# Patient Record
Sex: Female | Born: 1975 | Race: White | Hispanic: No | Marital: Married | Smoking: Current some day smoker
Health system: Southern US, Community
[De-identification: ages and names within clinical notes are randomized; demographics above are authoritative.]

## PROBLEM LIST (undated history)

## (undated) HISTORY — PX: EXTERNAL EAR SURGERY: SHX627

## (undated) HISTORY — PX: ABDOMINAL HYSTERECTOMY: SHX81

## (undated) HISTORY — PX: KNEE ARTHROPLASTY: SHX992

---

## 1998-05-12 ENCOUNTER — Other Ambulatory Visit: Admission: RE | Admit: 1998-05-12 | Discharge: 1998-05-12 | Payer: Self-pay | Admitting: Obstetrics and Gynecology

## 1999-03-31 ENCOUNTER — Other Ambulatory Visit: Admission: RE | Admit: 1999-03-31 | Discharge: 1999-03-31 | Payer: Self-pay | Admitting: Obstetrics and Gynecology

## 2000-04-13 ENCOUNTER — Other Ambulatory Visit: Admission: RE | Admit: 2000-04-13 | Discharge: 2000-04-13 | Payer: Self-pay | Admitting: Gynecology

## 2000-07-31 ENCOUNTER — Other Ambulatory Visit: Admission: RE | Admit: 2000-07-31 | Discharge: 2000-07-31 | Payer: Self-pay | Admitting: Obstetrics and Gynecology

## 2000-11-27 ENCOUNTER — Other Ambulatory Visit: Admission: RE | Admit: 2000-11-27 | Discharge: 2000-11-27 | Payer: Self-pay | Admitting: Gynecology

## 2001-06-21 ENCOUNTER — Other Ambulatory Visit: Admission: RE | Admit: 2001-06-21 | Discharge: 2001-06-21 | Payer: Self-pay | Admitting: *Deleted

## 2002-03-18 ENCOUNTER — Other Ambulatory Visit: Admission: RE | Admit: 2002-03-18 | Discharge: 2002-03-18 | Payer: Self-pay | Admitting: *Deleted

## 2002-06-26 ENCOUNTER — Other Ambulatory Visit: Admission: RE | Admit: 2002-06-26 | Discharge: 2002-06-26 | Payer: Self-pay | Admitting: *Deleted

## 2002-10-21 ENCOUNTER — Encounter: Admission: RE | Admit: 2002-10-21 | Discharge: 2003-01-19 | Payer: Self-pay | Admitting: *Deleted

## 2002-11-14 ENCOUNTER — Encounter: Admission: RE | Admit: 2002-11-14 | Discharge: 2002-11-14 | Payer: Self-pay | Admitting: Family Medicine

## 2003-07-21 ENCOUNTER — Other Ambulatory Visit: Admission: RE | Admit: 2003-07-21 | Discharge: 2003-07-21 | Payer: Self-pay | Admitting: Gynecology

## 2004-08-10 ENCOUNTER — Other Ambulatory Visit: Admission: RE | Admit: 2004-08-10 | Discharge: 2004-08-10 | Payer: Self-pay | Admitting: Gynecology

## 2005-09-15 ENCOUNTER — Other Ambulatory Visit: Admission: RE | Admit: 2005-09-15 | Discharge: 2005-09-15 | Payer: Self-pay | Admitting: Obstetrics and Gynecology

## 2006-01-04 ENCOUNTER — Ambulatory Visit (HOSPITAL_COMMUNITY): Admission: RE | Admit: 2006-01-04 | Discharge: 2006-01-04 | Payer: Self-pay | Admitting: Obstetrics and Gynecology

## 2006-02-04 ENCOUNTER — Inpatient Hospital Stay (HOSPITAL_COMMUNITY): Admission: AD | Admit: 2006-02-04 | Discharge: 2006-02-11 | Payer: Self-pay | Admitting: Obstetrics and Gynecology

## 2006-02-05 ENCOUNTER — Ambulatory Visit: Payer: Self-pay | Admitting: Neonatology

## 2006-04-06 ENCOUNTER — Ambulatory Visit (HOSPITAL_COMMUNITY): Admission: RE | Admit: 2006-04-06 | Discharge: 2006-04-06 | Payer: Self-pay | Admitting: Obstetrics and Gynecology

## 2006-11-27 ENCOUNTER — Inpatient Hospital Stay (HOSPITAL_COMMUNITY): Admission: AD | Admit: 2006-11-27 | Discharge: 2006-11-27 | Payer: Self-pay | Admitting: Obstetrics and Gynecology

## 2006-11-28 ENCOUNTER — Inpatient Hospital Stay (HOSPITAL_COMMUNITY): Admission: AD | Admit: 2006-11-28 | Discharge: 2006-12-01 | Payer: Self-pay | Admitting: Obstetrics and Gynecology

## 2006-12-03 ENCOUNTER — Inpatient Hospital Stay (HOSPITAL_COMMUNITY): Admission: AD | Admit: 2006-12-03 | Discharge: 2006-12-03 | Payer: Self-pay | Admitting: Obstetrics and Gynecology

## 2006-12-16 ENCOUNTER — Inpatient Hospital Stay (HOSPITAL_COMMUNITY): Admission: AD | Admit: 2006-12-16 | Discharge: 2006-12-18 | Payer: Self-pay | Admitting: Obstetrics and Gynecology

## 2008-07-23 ENCOUNTER — Inpatient Hospital Stay (HOSPITAL_COMMUNITY): Admission: AD | Admit: 2008-07-23 | Discharge: 2008-07-24 | Payer: Self-pay | Admitting: Obstetrics and Gynecology

## 2008-09-18 ENCOUNTER — Ambulatory Visit (HOSPITAL_COMMUNITY): Admission: RE | Admit: 2008-09-18 | Discharge: 2008-09-18 | Payer: Self-pay | Admitting: Obstetrics and Gynecology

## 2009-02-15 ENCOUNTER — Encounter: Admission: RE | Admit: 2009-02-15 | Discharge: 2009-02-15 | Payer: Self-pay | Admitting: Chiropractic Medicine

## 2009-04-22 ENCOUNTER — Emergency Department (HOSPITAL_COMMUNITY): Admission: EM | Admit: 2009-04-22 | Discharge: 2009-04-22 | Payer: Self-pay | Admitting: Emergency Medicine

## 2009-11-05 ENCOUNTER — Ambulatory Visit (HOSPITAL_COMMUNITY): Admission: RE | Admit: 2009-11-05 | Discharge: 2009-11-05 | Payer: Self-pay | Admitting: Obstetrics and Gynecology

## 2010-03-08 ENCOUNTER — Encounter (INDEPENDENT_AMBULATORY_CARE_PROVIDER_SITE_OTHER): Payer: Self-pay | Admitting: Obstetrics and Gynecology

## 2010-03-08 ENCOUNTER — Ambulatory Visit (HOSPITAL_COMMUNITY): Admission: RE | Admit: 2010-03-08 | Discharge: 2010-03-09 | Payer: Self-pay | Admitting: Obstetrics and Gynecology

## 2010-03-16 ENCOUNTER — Inpatient Hospital Stay (HOSPITAL_COMMUNITY): Admission: AD | Admit: 2010-03-16 | Discharge: 2010-03-16 | Payer: Self-pay | Admitting: Obstetrics & Gynecology

## 2010-05-28 ENCOUNTER — Encounter: Admission: RE | Admit: 2010-05-28 | Discharge: 2010-05-28 | Payer: Self-pay | Admitting: Orthopaedic Surgery

## 2010-11-05 LAB — URINE MICROSCOPIC-ADD ON

## 2010-11-05 LAB — DIFFERENTIAL
Basophils Absolute: 0.1 10*3/uL (ref 0.0–0.1)
Lymphocytes Relative: 10 % — ABNORMAL LOW (ref 12–46)
Lymphs Abs: 1.5 10*3/uL (ref 0.7–4.0)
Monocytes Absolute: 1.4 10*3/uL — ABNORMAL HIGH (ref 0.1–1.0)
Monocytes Relative: 9 % (ref 3–12)
Neutro Abs: 11.8 10*3/uL — ABNORMAL HIGH (ref 1.7–7.7)

## 2010-11-05 LAB — COMPREHENSIVE METABOLIC PANEL
Albumin: 3 g/dL — ABNORMAL LOW (ref 3.5–5.2)
BUN: 8 mg/dL (ref 6–23)
Creatinine, Ser: 0.85 mg/dL (ref 0.4–1.2)
Total Protein: 6.8 g/dL (ref 6.0–8.3)

## 2010-11-05 LAB — CBC
HCT: 29.1 % — ABNORMAL LOW (ref 36.0–46.0)
Hemoglobin: 10.4 g/dL — ABNORMAL LOW (ref 12.0–15.0)
MCHC: 35.3 g/dL (ref 30.0–36.0)
MCV: 92.5 fL (ref 78.0–100.0)
Platelets: 323 10*3/uL (ref 150–400)
RBC: 3.15 MIL/uL — ABNORMAL LOW (ref 3.87–5.11)
RBC: 3.17 MIL/uL — ABNORMAL LOW (ref 3.87–5.11)
WBC: 15.1 10*3/uL — ABNORMAL HIGH (ref 4.0–10.5)

## 2010-11-05 LAB — URINALYSIS, ROUTINE W REFLEX MICROSCOPIC
Hgb urine dipstick: NEGATIVE
Nitrite: NEGATIVE
Specific Gravity, Urine: 1.025 (ref 1.005–1.030)
pH: 5.5 (ref 5.0–8.0)

## 2010-11-05 LAB — URINE CULTURE

## 2010-11-06 LAB — CBC
HCT: 39.5 % (ref 36.0–46.0)
MCV: 92.7 fL (ref 78.0–100.0)
RDW: 12.4 % (ref 11.5–15.5)
WBC: 7.2 10*3/uL (ref 4.0–10.5)

## 2010-11-13 LAB — CBC
HCT: 41.3 % (ref 36.0–46.0)
Hemoglobin: 13.9 g/dL (ref 12.0–15.0)
MCV: 92.4 fL (ref 78.0–100.0)
Platelets: 314 10*3/uL (ref 150–400)
RBC: 4.47 MIL/uL (ref 3.87–5.11)
WBC: 8.1 10*3/uL (ref 4.0–10.5)

## 2010-11-13 LAB — COMPREHENSIVE METABOLIC PANEL
BUN: 11 mg/dL (ref 6–23)
CO2: 28 mEq/L (ref 19–32)
Chloride: 103 mEq/L (ref 96–112)
Creatinine, Ser: 0.74 mg/dL (ref 0.4–1.2)
GFR calc non Af Amer: >60 mL/min (ref >60)
Glucose, Bld: 90 mg/dL (ref 70–99)
Total Bilirubin: 0.7 mg/dL (ref 0.3–1.2)

## 2010-11-25 LAB — BASIC METABOLIC PANEL
Calcium: 9.4 mg/dL (ref 8.4–10.5)
GFR calc non Af Amer: >60 mL/min (ref >60)
Glucose, Bld: 118 mg/dL — ABNORMAL HIGH (ref 70–99)
Sodium: 135 mEq/L (ref 135–145)

## 2010-11-25 LAB — URINE MICROSCOPIC-ADD ON

## 2010-11-25 LAB — URINALYSIS, ROUTINE W REFLEX MICROSCOPIC
Nitrite: NEGATIVE
Protein, ur: 100 mg/dL — AB
Specific Gravity, Urine: 1.011 (ref 1.005–1.030)
Urobilinogen, UA: 0.2 mg/dL (ref 0.0–1.0)

## 2010-11-25 LAB — CBC
Hemoglobin: 13.5 g/dL (ref 12.0–15.0)
Platelets: 256 10*3/uL (ref 150–400)
RDW: 12.8 % (ref 11.5–15.5)
WBC: 18.3 10*3/uL — ABNORMAL HIGH (ref 4.0–10.5)

## 2010-11-25 LAB — TYPE AND SCREEN: Antibody Screen: NEGATIVE

## 2010-11-25 LAB — DIFFERENTIAL
Basophils Absolute: 0 10*3/uL (ref 0.0–0.1)
Lymphocytes Relative: 8 % — ABNORMAL LOW (ref 12–46)
Monocytes Absolute: 0.9 10*3/uL (ref 0.1–1.0)
Neutro Abs: 16 10*3/uL — ABNORMAL HIGH (ref 1.7–7.7)

## 2010-11-25 LAB — POCT PREGNANCY, URINE: Preg Test, Ur: NEGATIVE

## 2010-12-05 LAB — HCG, SERUM, QUALITATIVE: Preg, Serum: NEGATIVE

## 2010-12-05 LAB — CBC
MCHC: 33.8 g/dL (ref 30.0–36.0)
RDW: 12.3 % (ref 11.5–15.5)

## 2011-01-03 NOTE — Op Note (Signed)
NAME:  Shannon Ramos, Shannon Ramos             ACCOUNT NO.:  [id number]   MEDICAL RECORD NO.:  [id number]          PATIENT TYPE:  AMB   LOCATION:  SDC                           FACILITY:  [facility]   PHYSICIAN:  Guy Sandifer. Henderson Cloud, M.D. DATE OF BIRTH:  Nov 09, 1975   DATE OF PROCEDURE:  DATE OF DISCHARGE:                               OPERATIVE REPORT   PREOPERATIVE DIAGNOSIS:  Desires permanent sterilization   POSTOPERATIVE DIAGNOSES:  Desires permanent sterilization and  endometriosis.   PROCEDURE:  Laparoscopy, bilateral tubal ligation with Filshie clips,  ablation of endometriosis.   SURGEON:  Guy Sandifer. Henderson Cloud, MD   ANESTHESIA:  General with endotracheal intubation.   ESTIMATED BLOOD LOSS:  Minimal.   INDICATIONS AND CONSENT:  This patient is a 35 year old married white  female, G4, P3, desires permanent sterilization.  Alternate methods have  been discussed.  Laparoscopy with Filshie clip application has been  discussed.  Potential risks and complications were discussed  preoperatively including but not limited to infection, organ damage,  bleeding requiring transfusion of blood products with HIV and hepatitis  acquisition, DVT, PE, pneumonia.  Permanence of the procedure, failure  rate, and increased ectopic risk has been reviewed.  All questions are  answered and consent is signed on the chart.   FINDINGS:  Upper abdomen is grossly normal.  Tubes and ovaries normal  bilaterally.  Anterior cul-de-sac is normal.  Immediately lateral to the  insertion of the left uterosacral ligament are dark brown patches of  endometriosis.   PROCEDURE:  The patient was taken to the operating room where she was  identified, placed in dorsal supine position and general anesthesia was  induced via endotracheal intubation.  She was then placed in the dorsal  lithotomy position where she was prepped abdominally and vaginally.  Bladder straight catheterized.  Hulka tenaculum was placed.  The uterus  was then  manipulated, and she was draped in a sterile fashion.  The  infraumbilical and suprapubic areas were injected in the midline with  0.5% plain Marcaine.  Small infraumbilical incision was made, and  disposable Veress needle was placed on the first attempt without  difficulty.  Normal syringe and drop test were noted.  Two liters of gas  were then insufflated under low pressure with good tympany in the right  upper quadrant.  Veress needle was removed, and a 10/11 Xcel bladeless  disposable trocar sleeve was placed using direct visualization with the  diagnostic laparoscopic.  After placement, the operative laparoscope was  used.  A small suprapubic incision was made, and a 5-mm Xcel bladeless  disposable trocar sleeve was placed in the midline under direct  visualization without difficulty.  The above findings were noted.  The  course of the left ureter was identified, seen to be clear in the area  of surgery.  Bipolar cautery was used to ablate the areas of  endometriosis.  Right fallopian tube was identified from cornu to  fimbria.  Filshie clip was applied in the proximal one-third.  Similar  procedure was carried out on the left side.  The Filshie clip applicator  was removed  and careful inspection reveals the full width of the tube to  be within the clip and the heel of the clip to be visualized through the  mesosalpinx.  The was true bilaterally.  A 10 mL of 0.5% plain Marcaine  was instilled in the peritoneal cavity.  All inserts were removed.  Suprapubic trocar sleeve was removed.  Pneumoperitoneum was reduced.  The umbilical trocar sleeve  was removed.  The skin incisions were closed with subcuticular 3-0  Monocryl suture.  Dermabond applied.  Hulka tenaculum was removed and no  bleeding was noted.  All counts were correct.  The patient was awakened  and taken to recovery room in stable condition.      Guy Sandifer Henderson Cloud, M.D.  Electronically Signed     JET/MEDQ  D:   09/18/2008  T:  09/19/2008  Job:  [id number]

## 2011-01-03 NOTE — H&P (Signed)
NAME:  Shannon Ramos, Shannon Ramos             ACCOUNT NO.:  [id number]   MEDICAL RECORD NO.:  [id number]          PATIENT TYPE:  AMB   LOCATION:  SDC                           [facility]ACILITY:  [facility]   PHYSICIAN:  Guy Sandifer. Henderson Cloud, M.D. DATE OF BIRTH:  11/13/1975   DATE OF ADMISSION:  DATE OF DISCHARGE:                              HISTORY & PHYSICAL   CHIEF COMPLAINT:  Desires permanent sterilization.   HISTORY OF PRESENT ILLNESS:  This patient is a 36 year old married white  female G4, P3 who desires permanent sterilization.  After discussion of  options she is being admitted for  laparoscopy with Filshie clip  application.  Potential risks and complications, alternatives have been  discussed preoperatively.   PAST MEDICAL HISTORY:  Negative.   PAST SURGICAL HISTORY:  Ear surgery at 35 years old.  Knee surgery x2.   OBSTETRICAL HISTORY:  Vaginal delivery x3.   FAMILY HISTORY:  Positive for heart disease, psychiatric disease, and  cancer.   MEDICATIONS:  None.   ALLERGIES:  No known drug allergies.   SOCIAL HISTORY:  Denies tobacco, alcohol, or drug abuse.   REVIEW OF SYSTEMS:  NEURO:  Denies headache.  CARDIAC:  Denies chest pain.  PULMONARY:  Denies shortness of breath.   PHYSICAL EXAMINATION:  VITAL SIGNS:  Height 5 feet 8 inches, weight 146  pounds, and blood pressure was 102/66.  LUNGS:  Clear to auscultation.  HEART:  Regular rate and rhythm.  ABDOMEN:  Soft and nontender without masses.  PELVIC:  Vulva, vagina, and cervix without lesion.  Uterus up and normal  size, mobile, and nontender.  Adnexa nontender without masses  EXTREMITIES:  Grossly within normal limits.  NEUROLOGICAL:  Grossly within normal limits.   ASSESSMENT:  Multiparity, desires permanent sterilization.   PLAN:  Laparoscopy with Filshie clip application.      Guy Sandifer Henderson Cloud, M.D.  Electronically Signed     JET/MEDQ  D:  09/16/2008  T:  09/17/2008  Job:  [id number]

## 2011-01-06 NOTE — Discharge Summary (Signed)
NAMENOUR, RODRIGUES             ACCOUNT NO.:  [id number]   MEDICAL RECORD NO.:  [id number]          PATIENT TYPE:  INP   LOCATION:  9158                          FACILITY:  [facility]   PHYSICIAN:  Juluis Mire, M.D.   DATE OF BIRTH:  1975/09/18   DATE OF ADMISSION:  11/28/2006  DATE OF DISCHARGE:  12/01/2006                               DISCHARGE SUMMARY   ADMITTING DIAGNOSIS:  Intrauterine pregnancy at 33+ weeks with preterm  labor.   DISCHARGE DIAGNOSIS:  Intrauterine pregnancy at 33+ weeks with preterm  labor.   For a complete history and physical, see written note.   HOSPITAL COURSE:  The patient was admitted at 33-5/7 for preterm labor  and cervical dilatation.  She did not respond to Terbutaline and was  started on magnesium sulfate.  She did stop contracting with magnesium  sulfate.  We switched her to Procardia which remained stable, and the  cervix actually improved.  It was 2 cm and 50% effaced.  She had  received betamethasone.  We also performed an ultrasound.  It showed an  estimated fetal weight of 5 pounds which was 55th percentile.  The  amniotic fluid was the 38th percentile.  Biophysical profile was 8/10.  On the day of discharge, she was remaining on the Procardia.  She had no  uterine activity.  The fetal heart rate was reactive.  The uterus was  nontender, and she was discharged home.   COMPLICATIONS:  None during stay in the hospital.  The patient  discharged home in stable condition.   DISPOSITION:  The patient to be at relative rest at home.  Continue  Procardia.  Preterm labor warning signs are given.  She will follow up  in the office early next week.      Juluis Mire, M.D.  Electronically Signed     JSM/MEDQ  D:  12/01/2006  T:  12/01/2006  Job:  [id number]

## 2011-01-06 NOTE — H&P (Signed)
NAME:  Shannon Ramos, Shannon Ramos             ACCOUNT NO.:  [id number]   MEDICAL RECORD NO.:  [id number]          PATIENT TYPE:  AMB   LOCATION:  SDC                           [facility]   PHYSICIAN:  Guy Sandifer. Henderson Cloud, M.D. DATE OF BIRTH:  30-Aug-1975   DATE OF ADMISSION:  04/06/2006  DATE OF DISCHARGE:                                HISTORY & PHYSICAL   CHIEF COMPLAINT:  Abnormal bleeding.   HISTORY OF PRESENT ILLNESS:  This patient is a 35 year old, married, white  female, G2, P1, status post vaginal delivery of a healthy child on February 09, 2006 who was doing well at her postpartum visit on March 12, 2006.  Following  that, she noticed increased bright red bleeding on and off.  She passed a  peanut-shaped bit of tissue from the vagina.  She has had no fever,  vomiting, or shaking chills.  Bleeding has not been heavier than a normal  cycle.  However, due to the tissue she passed, she was concerned about the  possibility of retained products of conception.  On examination on April 03, 2006, a 1 x 3 cm portion of tissue was noted in the vagina at that time  of exam.  The uterus was otherwise nontender and mobile.  An ultrasound that  day revealed a 9 x 3.9 x 6.3 cm uterus with a 15 mm echogenic area in the  endometrial cavity suspicious for retained products of conception.  After  discussion of the options, she is being admitted for dilatation and  evacuation.  Potential risks and complications have been discussed  preoperatively.   PAST MEDICAL HISTORY:  Anxiety and depression.   PAST SURGICAL HISTORY:  Reconstruction of left knee in 2000.   FAMILY HISTORY:  Skin cancer in maternal grandfather.  Depression in  maternal grandmother.   OB HISTORY:  Vaginal delivery as above.   MEDICATIONS:  Vitamins.   ALLERGIES:  No known drug allergies.   SOCIAL HISTORY:  Denies tobacco, alcohol or drug abuse.   REVIEW OF SYSTEMS:  NEUROLOGIC:  Denies headache.   REVIEW OF SYSTEMS:  NEUROLOGIC:   Denies headache.  CARDIOVASCULAR:  Denies  chest pain.  PULMONARY:  Denies shortness of breath.  GI:  Denies recent  changes in bowel habits.   PHYSICAL EXAMINATION:  VITAL SIGNS:  Height 5 feet 8 inches, weight 149  pounds.  Blood pressure 128/88.  LUNGS:  Clear to auscultation.  HEART:  Regular rate and rhythm.  BREASTS: Not examined.  ABDOMEN:  Soft, nontender without masses.  PELVIC:  Vulva, vagina and cervix without lesion.  Uterus is mobile,  nontender.  Adnexa nontender without masses.  EXTREMITIES:  Grossly within normal limits.  NEUROLOGIC: Grossly within normal limits.   ASSESSMENT:  Irregular menses, possible retained products of conception.   PLAN:  Dilatation and evacuation.      Guy Sandifer Henderson Cloud, M.D.  Electronically Signed     JET/MEDQ  D:  04/05/2006  T:  04/05/2006  Job:  [id number]

## 2011-05-26 LAB — CBC
Hemoglobin: 10.7 g/dL — ABNORMAL LOW (ref 12.0–15.0)
Platelets: 338 10*3/uL (ref 150–400)
RBC: 3.19 MIL/uL — ABNORMAL LOW (ref 3.87–5.11)
WBC: 16.5 10*3/uL — ABNORMAL HIGH (ref 4.0–10.5)

## 2011-05-26 LAB — RPR: RPR Ser Ql: NONREACTIVE

## 2013-03-31 ENCOUNTER — Encounter (HOSPITAL_BASED_OUTPATIENT_CLINIC_OR_DEPARTMENT_OTHER): Payer: Self-pay | Admitting: *Deleted

## 2013-03-31 ENCOUNTER — Emergency Department (HOSPITAL_BASED_OUTPATIENT_CLINIC_OR_DEPARTMENT_OTHER)
Admission: EM | Admit: 2013-03-31 | Discharge: 2013-04-01 | Disposition: A | Discharge status: Home / Self Care | Payer: Managed Care, Other (non HMO) | Attending: Emergency Medicine | Admitting: Emergency Medicine

## 2013-03-31 ENCOUNTER — Emergency Department (HOSPITAL_BASED_OUTPATIENT_CLINIC_OR_DEPARTMENT_OTHER): Payer: Managed Care, Other (non HMO)

## 2013-03-31 DIAGNOSIS — S0180XA Unspecified open wound of other part of head, initial encounter: Secondary | ICD-10-CM | POA: Insufficient documentation

## 2013-03-31 DIAGNOSIS — F17200 Nicotine dependence, unspecified, uncomplicated: Secondary | ICD-10-CM | POA: Insufficient documentation

## 2013-03-31 DIAGNOSIS — Y9289 Other specified places as the place of occurrence of the external cause: Secondary | ICD-10-CM | POA: Insufficient documentation

## 2013-03-31 DIAGNOSIS — S01111A Laceration without foreign body of right eyelid and periocular area, initial encounter: Secondary | ICD-10-CM

## 2013-03-31 DIAGNOSIS — W64XXXA Exposure to other animate mechanical forces, initial encounter: Secondary | ICD-10-CM | POA: Insufficient documentation

## 2013-03-31 DIAGNOSIS — Y9389 Activity, other specified: Secondary | ICD-10-CM | POA: Insufficient documentation

## 2013-03-31 DIAGNOSIS — S060X0A Concussion without loss of consciousness, initial encounter: Secondary | ICD-10-CM

## 2013-03-31 MED ORDER — HYDROCODONE-ACETAMINOPHEN 5-325 MG PO TABS
1.0000 | ORAL_TABLET | Freq: Once | ORAL | Status: AC
  Administered 2013-03-31: 1 via ORAL
  Filled 2013-03-31: qty 1

## 2013-03-31 NOTE — ED Notes (Signed)
Pt escorted to restroom by this RT.

## 2013-03-31 NOTE — ED Provider Notes (Signed)
CSN: [id number]     Arrival date & time 03/31/13  2048 History     First MD Initiated Contact with Patient 03/31/13 2151     Chief Complaint  Patient presents with  . Head Laceration   (Consider location/radiation/quality/duration/timing/severity/associated sxs/prior Treatment) HPI Patient presents to the emergency department with laceration above her right from a horse kicking her in the face.  Patient, states she was hit by the knee of the horse, not the hoof.  Patient denies loss of consciousness, nausea, vomiting, diarrhea, incontinence, blurred vision, weakness, numbness, dizziness, syncope, seizure, neck pain, chest pain, or shortness of breath.  Patient, states, that she did not take any medications prior to arrival.  Patient, states, that she did apply pressure to the wound. History reviewed. No pertinent past medical history. Past Surgical History  Procedure Laterality Date  . Abdominal hysterectomy    . Knee arthroplasty     History reviewed. No pertinent family history. History  Substance Use Topics  . Smoking status: Current Some Day Smoker  . Smokeless tobacco: Not on file  . Alcohol Use: No   OB History   Grav Para Term Preterm Abortions TAB SAB Ect Mult Living                 Review of Systems All other systems negative except as documented in the HPI. All pertinent positives and negatives as reviewed in the HPI. Allergies  Review of patient's allergies indicates no known allergies.  Home Medications  No current outpatient prescriptions on file. BP 118/78  Pulse 67  Temp(Src) 98 F (36.7 C) (Oral)  Resp 16  Ht 5\' 8"  (1.727 m)  Wt 135 lb (61.236 kg)  BMI 20.53 kg/m2  SpO2 100% Physical Exam  Nursing note and vitals reviewed. Constitutional: She is oriented to person, place, and time. She appears well-developed and well-nourished.  HENT:  Head: Normocephalic.  Mouth/Throat: Oropharynx is clear and moist.  Eyes: EOM are normal. Pupils are equal, round,  and reactive to light.  Neck: Normal range of motion. Neck supple.  Cardiovascular: Normal rate, regular rhythm and normal heart sounds.  Exam reveals no gallop and no friction rub.   No murmur heard. Pulmonary/Chest: Effort normal and breath sounds normal. She exhibits no tenderness and no deformity.  Musculoskeletal:       Cervical back: Normal.       Thoracic back: Normal.       Lumbar back: Normal.  Neurological: She is alert and oriented to person, place, and time. She exhibits normal muscle tone. Coordination and gait normal. GCS eye subscore is 4. GCS verbal subscore is 5. GCS motor subscore is 6.    ED Course   Procedures (including critical care time)  Labs Reviewed - No data to display Ct Head Wo Contrast  03/31/2013   *RADIOLOGY REPORT*  Clinical Data:  History of trauma after being kicked in the head by horse.  CT HEAD WITHOUT CONTRAST CT MAXILLOFACIAL WITHOUT CONTRAST  Technique:  Multidetector CT imaging of the head and maxillofacial structures were performed using the standard protocol without intravenous contrast. Multiplanar CT image reconstructions of the maxillofacial structures were also generated.  Comparison:  Head CT 04/22/2009.  CT HEAD  Findings: No acute displaced skull fractures are identified.  No acute intracranial abnormality.  Specifically, no evidence of acute post-traumatic intracranial hemorrhage, no definite regions of acute/subacute cerebral ischemia, no focal mass, mass effect, hydrocephalus or abnormal intra or extra-axial fluid collections. The visualized paranasal sinuses and  mastoids are well pneumatized.  IMPRESSION: 1.  No acute displaced skull fractures or acute intracranial abnormalities. 2.  The appearance of the brain is normal.  CT MAXILLOFACIAL  Findings:   No acute facial bone fractures are identified. Pterygoid plates are intact.  Mandibular condyles are located bilaterally.  Bilateral globes and retro-orbital soft tissues are grossly normal.   IMPRESSION: 1.  No evidence of significant acute traumatic injury to the facial bones.   Original Report Authenticated By: Trudie Reed, M.D.   Ct Maxillofacial Wo Cm  03/31/2013   *RADIOLOGY REPORT*  Clinical Data:  History of trauma after being kicked in the head by horse.  CT HEAD WITHOUT CONTRAST CT MAXILLOFACIAL WITHOUT CONTRAST  Technique:  Multidetector CT imaging of the head and maxillofacial structures were performed using the standard protocol without intravenous contrast. Multiplanar CT image reconstructions of the maxillofacial structures were also generated.  Comparison:  Head CT 04/22/2009.  CT HEAD  Findings: No acute displaced skull fractures are identified.  No acute intracranial abnormality.  Specifically, no evidence of acute post-traumatic intracranial hemorrhage, no definite regions of acute/subacute cerebral ischemia, no focal mass, mass effect, hydrocephalus or abnormal intra or extra-axial fluid collections. The visualized paranasal sinuses and mastoids are well pneumatized.  IMPRESSION: 1.  No acute displaced skull fractures or acute intracranial abnormalities. 2.  The appearance of the brain is normal.  CT MAXILLOFACIAL  Findings:   No acute facial bone fractures are identified. Pterygoid plates are intact.  Mandibular condyles are located bilaterally.  Bilateral globes and retro-orbital soft tissues are grossly normal.  IMPRESSION: 1.  No evidence of significant acute traumatic injury to the facial bones.   Original Report Authenticated By: Trudie Reed, M.D.   LACERATION REPAIR Performed by: Carlyle Dolly Authorized by: Carlyle Dolly Consent: Verbal consent obtained. Risks and benefits: risks, benefits and alternatives were discussed Consent given by: patient Patient identity confirmed: provided demographic data Prepped and Draped in normal sterile fashion Wound explored  Laceration Location: Right eyebrow  Laceration Length: 4 cm  No Foreign Bodies  seen or palpated  Anesthesia: local infiltration  Local anesthetic: lidocaine 2% without epinephrine  Anesthetic total: 7 ml  Irrigation method: syringe Amount of cleaning: standard  Skin closure: 6-0 Prolene   Number of sutures: 11   Technique: Simple interrupted   Patient tolerance: Patient tolerated the procedure well with no immediate complications.  Patient is advised to return here for any worsening in her condition.  Also advised followup with her primary care Dr. told to have sutures out in 5 days MDM    Carlyle Dolly, PA-C 04/01/13 0036  Carlyle Dolly, PA-C 04/01/13 0036

## 2013-03-31 NOTE — ED Notes (Signed)
Pt c/o laceration above right eyebrow x 1 hr ago

## 2013-03-31 NOTE — ED Notes (Signed)
Pt reporting headache, MD notified.

## 2013-04-01 MED ORDER — HYDROCODONE-ACETAMINOPHEN 5-325 MG PO TABS: 1.0000 | ORAL_TABLET | Freq: Four times a day (QID) | ORAL | Status: DC | PRN

## 2013-04-01 NOTE — ED Provider Notes (Signed)
Medical screening examination/treatment/procedure(s) were performed by non-physician practitioner and as supervising physician I was immediately available for consultation/collaboration.  Jasmine Awe, MD 04/01/13 [phone]

## 2014-05-06 IMAGING — CT CT MAXILLOFACIAL W/O CM
1 series · 1 of 2 positions shown · non-contrast
Comparison: Head CT 04/22/2009.

CT HEAD

CLINICAL DATA: History of trauma after being kicked in the head by
horse.

CT HEAD WITHOUT CONTRAST
CT MAXILLOFACIAL WITHOUT CONTRAST
TECHNIQUE: Multidetector CT imaging of the head and maxillofacial
structures were performed using the standard protocol without
intravenous contrast. Multiplanar CT image reconstructions of the
maxillofacial structures were also generated.

[Series 1: topogram 0.6 t20s · sagittal · 1.00mm/px · 1 of 2 slices shown]
[im 2/2]
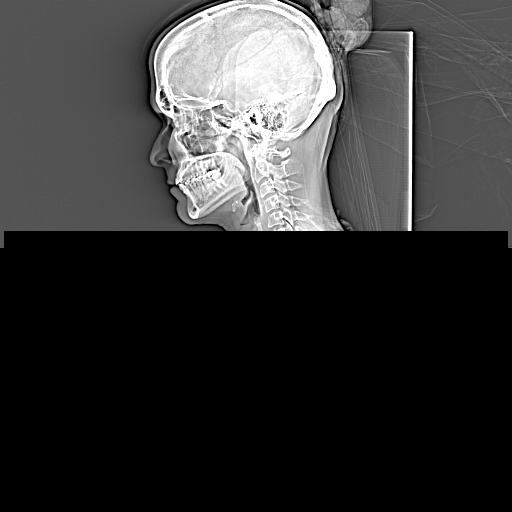

[1 of 2 positions shown; findings below may reference images not displayed]

FINDINGS: No acute displaced skull fractures are identified.  No
acute intracranial abnormality.  Specifically, no evidence of acute
post-traumatic intracranial hemorrhage, no definite regions of
acute/subacute cerebral ischemia, no focal mass, mass effect,
hydrocephalus or abnormal intra or extra-axial fluid collections.
The visualized paranasal sinuses and mastoids are well pneumatized.
IMPRESSION: 1.  No acute displaced skull fractures or acute intracranial
abnormalities.
2.  The appearance of the brain is normal.

CT MAXILLOFACIAL
FINDINGS: No acute facial bone fractures are identified.
Pterygoid plates are intact.  Mandibular condyles are located
bilaterally.  Bilateral globes and retro-orbital soft tissues are
grossly normal.
IMPRESSION: 1.  No evidence of significant acute traumatic injury to the facial
bones.

## 2015-10-22 ENCOUNTER — Other Ambulatory Visit: Payer: Self-pay | Admitting: Orthopaedic Surgery

## 2015-10-29 NOTE — H&P (Signed)
Shannon BundeLaura W Ramos is an 40 y.o. female.   Chief Complaint: left shoulder pain HPI: Shannon Ramos is here again about her left shoulder.  [facility] couple of weeks but the pain is now back.  Her pain is intermittent and moderate to severe.    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  Past Surgical History  Procedure Laterality Date  . Abdominal hysterectomy    . Knee arthroplasty      No family history on file. Social History:  reports that she has been smoking.  She does not have any smokeless tobacco history on file. She reports that she does not drink alcohol or use illicit drugs.  Allergies: No Known Allergies  No prescriptions prior to admission    No results found for this or any previous visit (from the past 48 hour(s)). No results found.  Review of Systems  Musculoskeletal: Positive for joint pain.       Left shoulder pain  All other systems reviewed and are negative.   There were no vitals taken for this visit. Physical Exam  Constitutional: She is oriented to person, place, and time. She appears well-developed and well-nourished.  HENT:  Head: Normocephalic and atraumatic.  Eyes: Pupils are equal, round, and reactive to light.  Neck: Normal range of motion.  Cardiovascular: Normal rate and regular rhythm.   Respiratory: Effort normal.  GI: Soft.  Musculoskeletal:  Left shoulder motion is full.  She has impingement pain in both positions.  She has good cuff strength which is painful in resisted external rotation.  She does have pain over her AC joint and pain to cross chest maneuvers.  Cervical motion is full and there is no palpable lymphadenopathy.  Sensation and motor function are intact in her hands with palpable pulses on both sides.    Neurological: She is alert and oriented to person, place, and time.  Skin: Skin is warm and dry.  Psychiatric: She has a normal mood  and affect. Her behavior is normal. Judgment and thought content normal.     Assessment/Plan Assessment: Left shoulder impingement and AC pain with MRI injected most recently 09/01/15  Plan: I think we can help Shannon Ramos with a shoulder arthroscopy.  Our intension will be to perform an acromioplasty and AC resection.  Based on her MRI she does not need a rotator cuff repair.  I think she'll be in a sling for a day or two and could get back to most of her regular jobs at the [employer] within a few days. I did review risk of anesthesia and infection related to the intervention.  Annaelle Kasel, Ginger OrganNDREW PAUL, PA-C 10/29/2015, 12:02 PM

## 2015-11-10 ENCOUNTER — Encounter (HOSPITAL_BASED_OUTPATIENT_CLINIC_OR_DEPARTMENT_OTHER): Payer: Self-pay | Admitting: *Deleted

## 2015-11-19 ENCOUNTER — Encounter (HOSPITAL_BASED_OUTPATIENT_CLINIC_OR_DEPARTMENT_OTHER)
Admission: RE | Disposition: A | Discharge status: Home / Self Care | Payer: Self-pay | Source: Ambulatory Visit | Attending: Orthopaedic Surgery

## 2015-11-19 ENCOUNTER — Encounter (HOSPITAL_BASED_OUTPATIENT_CLINIC_OR_DEPARTMENT_OTHER): Payer: Self-pay | Admitting: Anesthesiology

## 2015-11-19 ENCOUNTER — Ambulatory Visit (HOSPITAL_BASED_OUTPATIENT_CLINIC_OR_DEPARTMENT_OTHER): Payer: Managed Care, Other (non HMO) | Admitting: Anesthesiology

## 2015-11-19 ENCOUNTER — Ambulatory Visit (HOSPITAL_BASED_OUTPATIENT_CLINIC_OR_DEPARTMENT_OTHER)
Admission: RE | Admit: 2015-11-19 | Discharge: 2015-11-19 | Disposition: A | Discharge status: Home / Self Care | Payer: Managed Care, Other (non HMO) | Source: Ambulatory Visit | Attending: Orthopaedic Surgery | Admitting: Orthopaedic Surgery

## 2015-11-19 DIAGNOSIS — F17200 Nicotine dependence, unspecified, uncomplicated: Secondary | ICD-10-CM | POA: Diagnosis not present

## 2015-11-19 DIAGNOSIS — M7542 Impingement syndrome of left shoulder: Secondary | ICD-10-CM | POA: Diagnosis not present

## 2015-11-19 DIAGNOSIS — M19012 Primary osteoarthritis, left shoulder: Secondary | ICD-10-CM | POA: Diagnosis not present

## 2015-11-19 SURGERY — SHOULDER ARTHROSCOPY WITH SUBACROMIAL DECOMPRESSION AND DISTAL CLAVICLE EXCISION
Anesthesia: General | Site: Shoulder | Laterality: Left

## 2015-11-19 MED ORDER — DEXTROSE 5 % IV SOLN
2.0000 g | INTRAVENOUS | Status: AC
  Administered 2015-11-19: 2 g via INTRAVENOUS

## 2015-11-19 MED ORDER — CHLORHEXIDINE GLUCONATE 4 % EX LIQD: 60.0000 mL | Freq: Once | CUTANEOUS | Status: DC

## 2015-11-19 MED ORDER — FENTANYL CITRATE (PF) 100 MCG/2ML IJ SOLN
INTRAMUSCULAR | Status: AC
  Filled 2015-11-19: qty 2

## 2015-11-19 MED ORDER — DEXAMETHASONE SODIUM PHOSPHATE 4 MG/ML IJ SOLN
INTRAMUSCULAR | Status: DC | PRN
  Administered 2015-11-19: 10 mg via INTRAVENOUS

## 2015-11-19 MED ORDER — SUCCINYLCHOLINE CHLORIDE 20 MG/ML IJ SOLN
INTRAMUSCULAR | Status: AC
  Filled 2015-11-19: qty 1

## 2015-11-19 MED ORDER — FENTANYL CITRATE (PF) 100 MCG/2ML IJ SOLN
INTRAMUSCULAR | Status: DC | PRN
  Administered 2015-11-19: 100 ug via INTRAVENOUS

## 2015-11-19 MED ORDER — MIDAZOLAM HCL 2 MG/2ML IJ SOLN
1.0000 mg | INTRAMUSCULAR | Status: DC | PRN
  Administered 2015-11-19: 2 mg via INTRAVENOUS

## 2015-11-19 MED ORDER — SODIUM CHLORIDE 0.9 % IR SOLN
Status: DC | PRN
  Administered 2015-11-19: 4000 mL

## 2015-11-19 MED ORDER — PROPOFOL 10 MG/ML IV BOLUS
INTRAVENOUS | Status: AC
  Filled 2015-11-19: qty 20

## 2015-11-19 MED ORDER — FENTANYL CITRATE (PF) 100 MCG/2ML IJ SOLN
50.0000 ug | INTRAMUSCULAR | Status: DC | PRN
  Administered 2015-11-19: 100 ug via INTRAVENOUS

## 2015-11-19 MED ORDER — CEFAZOLIN SODIUM-DEXTROSE 2-4 GM/100ML-% IV SOLN
INTRAVENOUS | Status: AC
  Filled 2015-11-19: qty 100

## 2015-11-19 MED ORDER — LACTATED RINGERS IV SOLN
INTRAVENOUS | Status: DC
  Administered 2015-11-19 (×2): via INTRAVENOUS

## 2015-11-19 MED ORDER — SCOPOLAMINE 1 MG/3DAYS TD PT72: 1.0000 | MEDICATED_PATCH | Freq: Once | TRANSDERMAL | Status: DC | PRN

## 2015-11-19 MED ORDER — SUCCINYLCHOLINE CHLORIDE 20 MG/ML IJ SOLN
INTRAMUSCULAR | Status: DC | PRN
  Administered 2015-11-19: 50 mg via INTRAVENOUS

## 2015-11-19 MED ORDER — GLYCOPYRROLATE 0.2 MG/ML IJ SOLN: 0.2000 mg | Freq: Once | INTRAMUSCULAR | Status: DC | PRN

## 2015-11-19 MED ORDER — BUPIVACAINE-EPINEPHRINE (PF) 0.5% -1:200000 IJ SOLN
INTRAMUSCULAR | Status: DC | PRN
  Administered 2015-11-19: 30 mL via PERINEURAL

## 2015-11-19 MED ORDER — LACTATED RINGERS IV SOLN
INTRAVENOUS | Status: DC
Start: 2015-11-19 — End: 2015-11-19

## 2015-11-19 MED ORDER — FENTANYL CITRATE (PF) 100 MCG/2ML IJ SOLN: 25.0000 ug | INTRAMUSCULAR | Status: DC | PRN

## 2015-11-19 MED ORDER — LIDOCAINE HCL (CARDIAC) 20 MG/ML IV SOLN
INTRAVENOUS | Status: DC | PRN
  Administered 2015-11-19: 50 mg via INTRAVENOUS

## 2015-11-19 MED ORDER — MIDAZOLAM HCL 2 MG/2ML IJ SOLN
INTRAMUSCULAR | Status: AC
  Filled 2015-11-19: qty 2

## 2015-11-19 MED ORDER — ONDANSETRON HCL 4 MG/2ML IJ SOLN
INTRAMUSCULAR | Status: AC
  Filled 2015-11-19: qty 2

## 2015-11-19 MED ORDER — LIDOCAINE HCL (CARDIAC) 20 MG/ML IV SOLN
INTRAVENOUS | Status: AC
  Filled 2015-11-19: qty 5

## 2015-11-19 MED ORDER — LACTATED RINGERS IV SOLN: INTRAVENOUS | Status: DC

## 2015-11-19 MED ORDER — MIDAZOLAM HCL 2 MG/2ML IJ SOLN
INTRAMUSCULAR | Status: AC
Start: 2015-11-19 — End: 2015-11-19
  Filled 2015-11-19: qty 2

## 2015-11-19 MED ORDER — MIDAZOLAM HCL 5 MG/5ML IJ SOLN
INTRAMUSCULAR | Status: DC | PRN
  Administered 2015-11-19: 2 mg via INTRAVENOUS

## 2015-11-19 MED ORDER — HYDROCODONE-ACETAMINOPHEN 5-325 MG PO TABS: 1.0000 | ORAL_TABLET | ORAL | Status: AC | PRN

## 2015-11-19 MED ORDER — DEXAMETHASONE SODIUM PHOSPHATE 10 MG/ML IJ SOLN
INTRAMUSCULAR | Status: AC
  Filled 2015-11-19: qty 1

## 2015-11-19 SURGICAL SUPPLY — 70 items
BENZOIN TINCTURE PRP APPL 2/3 (GAUZE/BANDAGES/DRESSINGS) IMPLANT
BLADE CUDA 5.5 (BLADE) IMPLANT
BLADE GREAT WHITE 4.2 (BLADE) ×3 IMPLANT
BLADE GREAT WHITE 4.2MM (BLADE) ×1
BLADE SURG 15 STRL LF DISP TIS (BLADE) IMPLANT
BLADE SURG 15 STRL SS (BLADE)
BUR VERTEX HOODED 4.5 (BURR) ×4 IMPLANT
CANNULA SHOULDER 7CM (CANNULA) ×4 IMPLANT
CANNULA TWIST IN 8.25X7CM (CANNULA) IMPLANT
CLOSURE WOUND 1/2 X4 (GAUZE/BANDAGES/DRESSINGS)
DECANTER SPIKE VIAL GLASS SM (MISCELLANEOUS) IMPLANT
DRAPE STERI 35X30 U-POUCH (DRAPES) ×4 IMPLANT
DRAPE U-SHAPE 47X51 STRL (DRAPES) ×4 IMPLANT
DRAPE U-SHAPE 76X120 STRL (DRAPES) ×8 IMPLANT
DRSG EMULSION OIL 3X3 NADH (GAUZE/BANDAGES/DRESSINGS) ×4 IMPLANT
DRSG PAD ABDOMINAL 8X10 ST (GAUZE/BANDAGES/DRESSINGS) ×4 IMPLANT
DURAPREP 26ML APPLICATOR (WOUND CARE) ×8 IMPLANT
ELECT MENISCUS 165MM 90D (ELECTRODE) IMPLANT
ELECT REM PT RETURN 9FT ADLT (ELECTROSURGICAL) ×4
ELECTRODE REM PT RTRN 9FT ADLT (ELECTROSURGICAL) ×2 IMPLANT
GAUZE SPONGE 4X4 12PLY STRL (GAUZE/BANDAGES/DRESSINGS) ×4 IMPLANT
GLOVE BIO SURGEON STRL SZ7 (GLOVE) ×4 IMPLANT
GLOVE BIO SURGEON STRL SZ8 (GLOVE) ×8 IMPLANT
GLOVE BIOGEL PI IND STRL 7.0 (GLOVE) ×2 IMPLANT
GLOVE BIOGEL PI IND STRL 7.5 (GLOVE) ×2 IMPLANT
GLOVE BIOGEL PI IND STRL 8 (GLOVE) ×4 IMPLANT
GLOVE BIOGEL PI INDICATOR 7.0 (GLOVE) ×2
GLOVE BIOGEL PI INDICATOR 7.5 (GLOVE) ×2
GLOVE BIOGEL PI INDICATOR 8 (GLOVE) ×4
GOWN STRL REUS W/ TWL LRG LVL3 (GOWN DISPOSABLE) ×4 IMPLANT
GOWN STRL REUS W/ TWL XL LVL3 (GOWN DISPOSABLE) ×4 IMPLANT
GOWN STRL REUS W/TWL LRG LVL3 (GOWN DISPOSABLE) ×4
GOWN STRL REUS W/TWL XL LVL3 (GOWN DISPOSABLE) ×4
LIQUID BAND (GAUZE/BANDAGES/DRESSINGS) IMPLANT
MANIFOLD NEPTUNE II (INSTRUMENTS) ×4 IMPLANT
NDL SUT 6 .5 CRC .975X.05 MAYO (NEEDLE) IMPLANT
NEEDLE MAYO TAPER (NEEDLE)
NEEDLE SCORPION MULTI FIRE (NEEDLE) IMPLANT
NS IRRIG 1000ML POUR BTL (IV SOLUTION) IMPLANT
PACK ARTHROSCOPY DSU (CUSTOM PROCEDURE TRAY) ×4 IMPLANT
PACK BASIN DAY SURGERY FS (CUSTOM PROCEDURE TRAY) ×4 IMPLANT
PASSER SUT SWANSON 36MM LOOP (INSTRUMENTS) IMPLANT
PENCIL BUTTON HOLSTER BLD 10FT (ELECTRODE) IMPLANT
SET ARTHROSCOPY TUBING (MISCELLANEOUS) ×2
SET ARTHROSCOPY TUBING LN (MISCELLANEOUS) ×2 IMPLANT
SHEET MEDIUM DRAPE 40X70 STRL (DRAPES) ×4 IMPLANT
SLEEVE SCD COMPRESS KNEE MED (MISCELLANEOUS) ×4 IMPLANT
SLING ARM FOAM STRAP LRG (SOFTGOODS) IMPLANT
SLING ARM MED ADULT FOAM STRAP (SOFTGOODS) ×4 IMPLANT
SLING ARM SM FOAM STRAP (SOFTGOODS) IMPLANT
SLING ARM XL FOAM STRAP (SOFTGOODS) IMPLANT
SPONGE LAP 4X18 X RAY DECT (DISPOSABLE) IMPLANT
STRIP CLOSURE SKIN 1/2X4 (GAUZE/BANDAGES/DRESSINGS) IMPLANT
SUCTION FRAZIER HANDLE 10FR (MISCELLANEOUS)
SUCTION TUBE FRAZIER 10FR DISP (MISCELLANEOUS) IMPLANT
SUT ETHIBOND 2 OS 4 DA (SUTURE) IMPLANT
SUT ETHILON 3 0 PS 1 (SUTURE) ×4 IMPLANT
SUT FIBERWIRE #2 38 T-5 BLUE (SUTURE)
SUT PDS AB 2-0 CT2 27 (SUTURE) IMPLANT
SUT VIC AB 0 SH 27 (SUTURE) IMPLANT
SUT VIC AB 2-0 SH 27 (SUTURE)
SUT VIC AB 2-0 SH 27XBRD (SUTURE) IMPLANT
SUT VICRYL 4-0 PS2 18IN ABS (SUTURE) IMPLANT
SUTURE FIBERWR #2 38 T-5 BLUE (SUTURE) IMPLANT
SYR BULB 3OZ (MISCELLANEOUS) IMPLANT
TOWEL OR 17X24 6PK STRL BLUE (TOWEL DISPOSABLE) ×4 IMPLANT
TOWEL OR NON WOVEN STRL DISP B (DISPOSABLE) ×4 IMPLANT
WAND STAR VAC 90 (SURGICAL WAND) ×4 IMPLANT
WATER STERILE IRR 1000ML POUR (IV SOLUTION) ×4 IMPLANT
YANKAUER SUCT BULB TIP NO VENT (SUCTIONS) IMPLANT

## 2015-11-19 NOTE — Anesthesia Preprocedure Evaluation (Signed)
Anesthesia Evaluation  Patient identified by MRN, date of birth, ID band Patient awake    Reviewed: Allergy & Precautions, H&P , NPO status , Patient's Chart, lab work & pertinent test results  Airway Mallampati: II  TM Distance: >3 FB Neck ROM: full    Dental no notable dental hx. (+) Dental Advisory Given, Teeth Intact   Pulmonary Current Smoker,    Pulmonary exam normal breath sounds clear to auscultation       Cardiovascular Exercise Tolerance: Good negative cardio ROS Normal cardiovascular exam Rhythm:regular Rate:Normal     Neuro/Psych negative neurological ROS  negative psych ROS   GI/Hepatic negative GI ROS, Neg liver ROS,   Endo/Other  negative endocrine ROS  Renal/GU negative Renal ROS  negative genitourinary   Musculoskeletal   Abdominal   Peds  Hematology negative hematology ROS (+)   Anesthesia Other Findings   Reproductive/Obstetrics negative OB ROS                             Anesthesia Physical Anesthesia Plan  ASA: II  Anesthesia Plan: General   Post-op Pain Management: GA combined w/ Regional for post-op pain   Induction: Intravenous  Airway Management Planned: Oral ETT  Additional Equipment:   Intra-op Plan:   Post-operative Plan: Extubation in OR  Informed Consent: I have reviewed the patients History and Physical, chart, labs and discussed the procedure including the risks, benefits and alternatives for the proposed anesthesia with the patient or authorized representative who has indicated his/her understanding and acceptance.   Dental Advisory Given  Plan Discussed with: CRNA and Surgeon  Anesthesia Plan Comments:         Anesthesia Quick Evaluation

## 2015-11-19 NOTE — Interval H&P Note (Signed)
OK for surgery PD 

## 2015-11-19 NOTE — Transfer of Care (Signed)
Immediate Anesthesia Transfer of Care Note  Patient: Shannon Ramos  Procedure(s) Performed: Procedure(s): ARTHROSCOPY LEFT SHOULDER; ACROMIOPLASTY, DEBRIDEMENT  AND DISTAL CLAVICLE RESECTION (Left)  Patient Location: PACU  Anesthesia Type:GA combined with regional for post-op pain  Level of Consciousness: awake and patient cooperative  Airway & Oxygen Therapy: Patient Spontanous Breathing and Patient connected to face mask oxygen  Post-op Assessment: Report given to RN and Post -op Vital signs reviewed and stable  Post vital signs: Reviewed and stable  Last Vitals:  Filed Vitals:   11/19/15 1215 11/19/15 1230  BP: 117/67 114/67  Pulse: 66 67  Temp:    Resp: 15 18    Complications: No apparent anesthesia complications

## 2015-11-19 NOTE — Op Note (Signed)
#  [id number] 

## 2015-11-19 NOTE — Anesthesia Postprocedure Evaluation (Signed)
Anesthesia Post Note  Patient: Shannon Ramos  Procedure(s) Performed: Procedure(s) (LRB): ARTHROSCOPY LEFT SHOULDER; ACROMIOPLASTY, DEBRIDEMENT  AND DISTAL CLAVICLE RESECTION (Left)  Patient location during evaluation: PACU Anesthesia Type: General Level of consciousness: awake and alert Pain management: pain level controlled Vital Signs Assessment: post-procedure vital signs reviewed and stable Respiratory status: spontaneous breathing, nonlabored ventilation, respiratory function stable and patient connected to nasal cannula oxygen Cardiovascular status: blood pressure returned to baseline and stable Postop Assessment: no signs of nausea or vomiting Anesthetic complications: no    Last Vitals:  Filed Vitals:   11/19/15 1431 11/19/15 1450  BP:  142/98  Pulse: 62 81  Temp:  36.9 C  Resp: 17 16    Last Pain:  Filed Vitals:   11/19/15 1454  PainSc: 0-No pain                 Dontavian Marchi L

## 2015-11-19 NOTE — Progress Notes (Signed)
Katherine BassetMaggie Linka CRNA, Assisted Dr Leta JunglingEwell with left, ultrasound guided, interscalene  block. Side rails up, monitors on throughout procedure. See vital signs in flow sheet. Tolerated Procedure well.

## 2015-11-19 NOTE — Discharge Instructions (Signed)
° ° °  Regional Anesthesia Blocks ° °1. Numbness or the inability to move the "blocked" extremity may last from 3-48 hours after placement. The length of time depends on the medication injected and your individual response to the medication. If the numbness is not going away after 48 hours, call your surgeon. ° °2. The extremity that is blocked will need to be protected until the numbness is gone and the  Strength has returned. Because you cannot feel it, you will need to take extra care to avoid injury. Because it may be weak, you may have difficulty moving it or using it. You may not know what position it is in without looking at it while the block is in effect. ° °3. For blocks in the legs and feet, returning to weight bearing and walking needs to be done carefully. You will need to wait until the numbness is entirely gone and the strength has returned. You should be able to move your leg and foot normally before you try and bear weight or walk. You will need someone to be with you when you first try to ensure you do not fall and possibly risk injury. ° °4. Bruising and tenderness at the needle site are common side effects and will resolve in a few days. ° °5. Persistent numbness or new problems with movement should be communicated to the surgeon or the [facility] ([phone])/ [facility] ([phone]). ° ° ° °Post Anesthesia Home Care Instructions ° °Activity: °Get plenty of rest for the remainder of the day. A responsible adult should stay with you for 24 hours following the procedure.  °For the next 24 hours, DO NOT: °-Drive a car °-Operate machinery °-Drink alcoholic beverages °-Take any medication unless instructed by your physician °-Make any legal decisions or sign important papers. ° °Meals: °Start with liquid foods such as gelatin or soup. Progress to regular foods as tolerated. Avoid greasy, spicy, heavy foods. If nausea and/or vomiting occur, drink only clear liquids until  the nausea and/or vomiting subsides. Call your physician if vomiting continues. ° °Special Instructions/Symptoms: °Your throat may feel dry or sore from the anesthesia or the breathing tube placed in your throat during surgery. If this causes discomfort, gargle with warm salt water. The discomfort should disappear within 24 hours. ° °If you had a scopolamine patch placed behind your ear for the management of post- operative nausea and/or vomiting: ° °1. The medication in the patch is effective for 72 hours, after which it should be removed.  Wrap patch in a tissue and discard in the trash. Wash hands thoroughly with soap and water. °2. You may remove the patch earlier than 72 hours if you experience unpleasant side effects which may include dry mouth, dizziness or visual disturbances. °3. Avoid touching the patch. Wash your hands with soap and water after contact with the patch. °  ° °

## 2015-11-19 NOTE — Anesthesia Procedure Notes (Addendum)
Anesthesia Regional Block:  Interscalene brachial plexus block  Pre-Anesthetic Checklist: ,, timeout performed, Correct Patient, Correct Site, Correct Laterality, Correct Procedure, Correct Position, site marked, Risks and benefits discussed,  Surgical consent,  Pre-op evaluation,  At surgeon's request and post-op pain management  Laterality: Left  Prep: chloraprep       Needles:  Injection technique: Single-shot  Needle Type: Echogenic Needle     Needle Length: 13cm 13 cm Needle Gauge: 21 and 21 G    Additional Needles:  Procedures: ultrasound guided (picture in chart) Interscalene brachial plexus block Narrative:  Start time: 11/19/2015 11:55 AM End time: 11/19/2015 12:05 PM Injection made incrementally with aspirations every 5 mL.  Performed by: Personally  Anesthesiologist: Ronelle NighEWELL, CHARLES  Additional Notes: Patient tolerated the procedure well without complications   Procedure Name: Intubation Date/Time: 11/19/2015 12:53 PM Performed by: Genevieve NorlanderLINKA, Taelyr Jantz L Pre-anesthesia Checklist: Patient identified, Emergency Drugs available, Suction available, Patient being monitored and Timeout performed Patient Re-evaluated:Patient Re-evaluated prior to inductionOxygen Delivery Method: Circle System Utilized Preoxygenation: Pre-oxygenation with 100% oxygen Intubation Type: IV induction Ventilation: Mask ventilation without difficulty Laryngoscope Size: Miller and 3 Grade View: Grade II Tube type: Oral Tube size: 7.0 mm Number of attempts: 1 Airway Equipment and Method: Stylet and Oral airway Placement Confirmation: ETT inserted through vocal cords under direct vision,  positive ETCO2 and breath sounds checked- equal and bilateral Secured at: 20 cm Tube secured with: Tape Dental Injury: Teeth and Oropharynx as per pre-operative assessment

## 2015-11-22 NOTE — Op Note (Signed)
NAMVerlan Friends:  Shannon Ramos, Shannon Ramos             ACCOUNT NO.:  [id number]  MEDICAL RECORD NO.:  [id number]  LOCATION:                                 FACILITY:  PHYSICIAN:  Lubertha Basqueeter G. Arianni Gallego, M.D.DATE OF BIRTH:  1975/08/24  DATE OF PROCEDURE:  11/19/2015 DATE OF DISCHARGE:                              OPERATIVE REPORT   PREOPERATIVE DIAGNOSES: 1. Left shoulder impingement. 2. Left shoulder AC pain.  POSTOPERATIVE DIAGNOSES: 1. Left shoulder impingement. 2. Left shoulder AC pain.  PROCEDURES: 1. Left shoulder arthroscopic acromioplasty. 2. Left shoulder arthroscopic debridement. 3. Left shoulder arthroscopic AC resection.  ANESTHESIA:  General and block.  ATTENDING SURGEON:  Lubertha Basqueeter G. Jerl Santosalldorf, M.D.  ASSISTANT:  Elodia FlorenceAndrew Nida, PA.  INDICATION FOR PROCEDURE:  The patient is a 40 year old woman with a long history of left shoulder pain.  This has persisted despite pills and therapies and injections which have afforded her transient relief. Most of her pain seems to be at the [facility] joint.  By MRI scan, she has some degenerative change at the [facility] joint and things consistent with impingement, but no full-thickness rotator cuff tear.  At this point, she has pain resting and pain trying to use her arm and she is offered an arthroscopy.  Informed operative consent was obtained after discussion of possible complications including reaction to anesthesia and infection.  SUMMARY OF FINDINGS AND PROCEDURE:  Under general anesthesia and a block, a left shoulder arthroscopy was performed.  Glenohumeral joint showed no degenerative changes, and the biceps tendon looked normal. The rotator cuff also looked normal from below.  In the subacromial space, she had some bursitis and irritation of the cuff and a debridement was done, but no tear worthy of repair was found.  She did have a moderately prominent subacromial morphology addressed with acromioplasty and also had bone-on-bone contact at the [facility]  joint addressed with a formal AC resection removing a centimeter of the distal clavicle.  She was discharged home.  DESCRIPTION OF PROCEDURE:  The patient was taken to the operating suite, where general anesthetic was applied without difficulty.  She was also given a block in the pre-anesthesia area.  She was positioned in a beach- chair position and prepped and draped in normal sterile fashion.  After the administration of preop IV Kefzol and an appropriate time out, an arthroscopy of the left shoulder was performed through total of 3 portals.  Findings were as noted above and procedure consisted predominantly of the acromioplasty done with a bur in the lateral position followed by transfer of bur to the posterior position.  I then performed the [facility] through an anterior portal removing a centimeter of the distal clavicle decompressing this joint.  The shoulder was thoroughly irrigated.  Again, no tear worthy of repair was found in terms of rotator cuff.  Reapproximated the portals loosely with nylon followed by Adaptic, dry gauze and tape.  Estimated blood loss and fluids obtained from anesthesia records.  DISPOSITION:  The patient was extubated in operating room and taken to recovery in stable condition.  She was to go home same-day and follow up in the office in less than a week .  I will contact  her by phone tonight.     Lubertha Basque Jerl Santos, M.D.     PGD/MEDQ  D:  11/19/2015  T:  11/20/2015  Job:  [id number]

## 2024-06-05 ENCOUNTER — Other Ambulatory Visit: Payer: Self-pay | Admitting: Obstetrics and Gynecology

## 2024-06-05 DIAGNOSIS — R928 Other abnormal and inconclusive findings on diagnostic imaging of breast: Secondary | ICD-10-CM

## 2024-06-16 ENCOUNTER — Ambulatory Visit
Admission: RE | Admit: 2024-06-16 | Discharge: 2024-06-16 | Disposition: A | Discharge status: Home / Self Care | Payer: Self-pay | Source: Ambulatory Visit | Attending: Obstetrics and Gynecology | Admitting: Obstetrics and Gynecology

## 2024-06-16 ENCOUNTER — Ambulatory Visit

## 2024-06-16 ENCOUNTER — Ambulatory Visit
Admission: RE | Admit: 2024-06-16 | Discharge: 2024-06-16 | Disposition: A | Source: Ambulatory Visit | Attending: Obstetrics and Gynecology | Admitting: Obstetrics and Gynecology

## 2024-06-16 ENCOUNTER — Other Ambulatory Visit: Payer: Self-pay | Admitting: Obstetrics and Gynecology

## 2024-06-16 DIAGNOSIS — R928 Other abnormal and inconclusive findings on diagnostic imaging of breast: Secondary | ICD-10-CM

## 2024-06-16 DIAGNOSIS — N6310 Unspecified lump in the right breast, unspecified quadrant: Secondary | ICD-10-CM

## 2024-06-20 ENCOUNTER — Other Ambulatory Visit: Payer: Self-pay | Admitting: Obstetrics and Gynecology

## 2024-06-20 ENCOUNTER — Ambulatory Visit
Admission: RE | Admit: 2024-06-20 | Discharge: 2024-06-20 | Disposition: A | Discharge status: Home / Self Care | Source: Ambulatory Visit | Attending: Obstetrics and Gynecology | Admitting: Obstetrics and Gynecology

## 2024-06-20 ENCOUNTER — Ambulatory Visit
Admission: RE | Admit: 2024-06-20 | Discharge: 2024-06-20 | Disposition: A | Discharge status: Home / Self Care | Payer: Self-pay | Source: Ambulatory Visit | Attending: Obstetrics and Gynecology | Admitting: Obstetrics and Gynecology

## 2024-06-20 DIAGNOSIS — N6310 Unspecified lump in the right breast, unspecified quadrant: Secondary | ICD-10-CM

## 2024-06-20 DIAGNOSIS — R928 Other abnormal and inconclusive findings on diagnostic imaging of breast: Secondary | ICD-10-CM

## 2024-06-20 HISTORY — PX: BREAST BIOPSY: SHX20

## 2024-06-23 LAB — SURGICAL PATHOLOGY
